# Patient Record
Sex: Female | Born: 1965 | Race: White | Hispanic: No | Marital: Married | Smoking: Former smoker
Health system: Southern US, Community
[De-identification: ages and names within clinical notes are randomized; demographics above are authoritative.]

## PROBLEM LIST (undated history)

## (undated) DIAGNOSIS — G4700 Insomnia, unspecified: Secondary | ICD-10-CM

## (undated) DIAGNOSIS — M797 Fibromyalgia: Secondary | ICD-10-CM

## (undated) DIAGNOSIS — J302 Other seasonal allergic rhinitis: Secondary | ICD-10-CM

## (undated) HISTORY — PX: FOOT SURGERY: SHX648

## (undated) HISTORY — PX: ABDOMINAL HYSTERECTOMY: SHX81

---

## 2004-09-25 HISTORY — PX: ENDOMETRIAL ABLATION: SHX621

## 2009-01-07 DIAGNOSIS — I10 Essential (primary) hypertension: Secondary | ICD-10-CM | POA: Insufficient documentation

## 2010-03-28 ENCOUNTER — Ambulatory Visit: Payer: Self-pay | Admitting: Family Medicine

## 2010-03-28 DIAGNOSIS — R5381 Other malaise: Secondary | ICD-10-CM

## 2010-03-28 DIAGNOSIS — R0602 Shortness of breath: Secondary | ICD-10-CM

## 2010-03-28 DIAGNOSIS — R5383 Other fatigue: Secondary | ICD-10-CM

## 2010-03-29 ENCOUNTER — Encounter: Payer: Self-pay | Admitting: Family Medicine

## 2010-10-25 NOTE — Assessment & Plan Note (Signed)
Summary: TROUBLE GETTING FULL BREATH/TJ   Vital Signs:  Patient Profile:   45 Years Old Female CC:      SOB, weakness, dizziness X this AM, rm 2 Height:     63 inches Weight:      132 pounds O2 Sat:      100 % O2 treatment:    Room Air Temp:     96.9 degrees F oral Pulse rate:   104 / minute Resp:     22 per minute BP sitting:   155 / 89  (left arm) Cuff size:   regular  Pt. in pain?   yes    Location:   left leg    Intensity:   7    Type:       dull  Vitals Entered By: Lajean Saver RN (March 28, 2010 12:55 PM)                   Updated Prior Medication List: AMBIEN 5 MG TABS (ZOLPIDEM TARTRATE) qhs FLEXERIL 5 MG TABS (CYCLOBENZAPRINE HCL) qhs ZOLOFT 50 MG TABS (SERTRALINE HCL) qhs VITAMIN B-12 100 MCG TABS (CYANOCOBALAMIN) once daily VITAMIN D 400 UNIT TABS (CHOLECALCIFEROL) once daily FISH OIL 1000 MG CAPS (OMEGA-3 FATTY ACIDS) once daily CALCIUM 500 MG TABS (CALCIUM) once daily  Current Allergies: No known allergies History of Present Illness Chief Complaint: SOB, weakness, dizziness X this AM, rm 2 History of Present Illness:  Subjective:  Patient complains of awakening this morning feeling somewhat light-headed and fatigued.  She noticed that joints such as her left hip that normally are uncomfortable were more painful than usual.  She also had mild swelling around her eyes and increased sinus congestion.  She had chills but no fever.  She has had a mild cough today.  She feels some restriction in her chest as if she is unable to get a full breath of air, but no shortness of breath or chest pain.  She is followed by Ardell Isaacs, NP for B12 deficiency.  She was started on oral B12 last month and is scheduled for a repeat B12 level next month. She states that she has not eaten since yesterday evening.  REVIEW OF SYSTEMS Constitutional Symptoms       Complains of fatigue.     Denies fever, chills, night sweats, weight loss, and weight gain.  Eyes       Denies  change in vision, eye pain, eye discharge, glasses, contact lenses, and eye surgery. Ear/Nose/Throat/Mouth       Complains of dizziness.      Denies hearing loss/aids, change in hearing, ear pain, ear discharge, frequent runny nose, frequent nose bleeds, sinus problems, sore throat, hoarseness, and tooth pain or bleeding.  Respiratory       Complains of shortness of breath.      Denies dry cough, productive cough, wheezing, asthma, bronchitis, and emphysema/COPD.  Cardiovascular       Denies murmurs, chest pain, and tires easily with exhertion.    Gastrointestinal       Denies stomach pain, nausea/vomiting, diarrhea, constipation, blood in bowel movements, and indigestion. Genitourniary       Denies painful urination, kidney stones, and loss of urinary control. Neurological       Complains of weakness.      Denies headaches, loss of or changes in sensation, numbness, tngling, tremors, paralysis, seizures, and fainting/blackouts. Musculoskeletal       Complains of muscle pain, joint pain, and joint stiffness.  Denies decreased range of motion, redness, swelling, muscle weakness, and gout.      Comments: left leg Skin       Denies bruising, unusual mles/lumps or sores, and hair/skin or nail changes.  Psych       Denies mood changes, temper/anger issues, anxiety/stress, speech problems, depression, and sleep problems. Other Comments: symptoms X this AM. 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  Extraocular  movement is intact.  Conjunctivae are not inflamed.  Ears:  Canals normal.  Tympanic membranes normal.   Nose:  Normal septum.  Normal turbinates, mildly congested.   No sinus tenderness present.  Pharynx:  Normal  Neck:  Supple.  No adenopathy is present.  No thyromegaly is present  Lungs:  Clear to auscultation.  Breath sounds are equal.  Heart:  Regular rate and rhythm without murmurs, rubs, or gallops.  Chest:  No chest wall tenderness Abdomen:  Nontender without masses or hepatosplenomegaly.  Bowel sounds are present.  No CVA or flank tenderness.  Extremities:  No edema.  Pedal pulses are full and equal.  No lower leg tenderness Skin:  No rash. EKG:  normal Chest X-ray:  No acute changes CBC:  WBc 9.0; Hgb 12.3   MCV increased 100.2; MCH increased 32.7 Assessment New Problems: FATIGUE, ACUTE (ICD-780.79) DYSPNEA (ICD-786.05)  SUSPECT EARLY VIRAL PRODROMAL SYMPTOMS.  Patient Education: Patient and/or caregiver instructed in the following: quit smoking.  Plan New Orders: CBC w/Diff [[id number]] EKG w/ Interpretation [93000] T-Chest x-ray, 2 views [71020] Planning Comments:   Patient felt better after eating a snack.   Recommend rest.  Treat symptomatically for now  Follow-up with PCP for B12 deficiency.   The patient and/or caregiver has been counseled thoroughly with regard to medications prescribed including dosage, schedule, interactions, rationale for use, and possible side effects and they verbalize understanding.  Diagnoses and expected course of recovery discussed and will return if not improved as expected or if the condition worsens. Patient and/or caregiver verbalized understanding.   Orders Added: 1)  CBC w/Diff [[id number]] 2)  EKG w/ Interpretation [93000] 3)  T-Chest x-ray, 2 views [71020]

## 2010-12-12 ENCOUNTER — Inpatient Hospital Stay (INDEPENDENT_AMBULATORY_CARE_PROVIDER_SITE_OTHER)
Admission: RE | Admit: 2010-12-12 | Discharge: 2010-12-12 | Disposition: A | Discharge status: Home / Self Care | Payer: Managed Care, Other (non HMO) | Source: Ambulatory Visit | Attending: Family Medicine | Admitting: Family Medicine

## 2010-12-12 ENCOUNTER — Encounter: Payer: Self-pay | Admitting: Family Medicine

## 2010-12-12 DIAGNOSIS — L259 Unspecified contact dermatitis, unspecified cause: Secondary | ICD-10-CM

## 2010-12-22 NOTE — Assessment & Plan Note (Signed)
Summary: REDNESS,SWELLING,RASH/WSE rm 2   Vital Signs:  Patient Profile:   45 Years Old Female CC:      rash/ ? poisin oak Height:     63 inches Weight:      131.75 pounds O2 Sat:      100 % O2 treatment:    Room Air Temp:     98.3 degrees F oral Pulse rate:   80 / minute Resp:     16 per minute BP sitting:   155 / 88  (left arm) Cuff size:   regular  Vitals Entered By: Clemens Catholic LPN (December 12, 2010 6:42 PM)                  Updated Prior Medication List: VITAMIN B-12 100 MCG TABS (CYANOCOBALAMIN) once daily VITAMIN D 400 UNIT TABS (CHOLECALCIFEROL) once daily FISH OIL 1000 MG CAPS (OMEGA-3 FATTY ACIDS) once daily CALCIUM 500 MG TABS (CALCIUM) once daily CYMBALTA 60 MG CPEP (DULOXETINE HCL)  MELOXICAM 15 MG TABS (MELOXICAM)  LORATADINE 10 MG TABS (LORATADINE)  TIZANIDINE HCL 4 MG TABS (TIZANIDINE HCL)  ZOLPIDEM TARTRATE 10 MG TABS (ZOLPIDEM TARTRATE)   Current Allergies: ! SULFAHistory of Present Illness Chief Complaint: rash/ ? poisin oak History of Present Illness:  Subjective:  Patient complains of pruritic rash that appeared on arms several days ago, now on mid abdomen and upper legs.  She believes that she came into contact with poson ivy.  Feels well otherwise.  No one in family with similar rash.  REVIEW OF SYSTEMS Constitutional Symptoms      Denies fever, chills, night sweats, weight loss, weight gain, and fatigue.  Eyes       Denies change in vision, eye pain, eye discharge, glasses, contact lenses, and eye surgery. Ear/Nose/Throat/Mouth       Denies hearing loss/aids, change in hearing, ear pain, ear discharge, dizziness, frequent runny nose, frequent nose bleeds, sinus problems, sore throat, hoarseness, and tooth pain or bleeding.  Respiratory       Denies dry cough, productive cough, wheezing, shortness of breath, asthma, bronchitis, and emphysema/COPD.  Cardiovascular       Denies murmurs, chest pain, and tires easily with exhertion.     Gastrointestinal       Denies stomach pain, nausea/vomiting, diarrhea, constipation, blood in bowel movements, and indigestion. Genitourniary       Denies painful urination, kidney stones, and loss of urinary control. Neurological       Denies paralysis, seizures, and fainting/blackouts. Musculoskeletal       Complains of redness and swelling.      Denies muscle pain, joint pain, joint stiffness, decreased range of motion, muscle weakness, and gout.  Skin       Denies bruising, unusual mles/lumps or sores, and hair/skin or nail changes.  Psych       Denies mood changes, temper/anger issues, anxiety/stress, speech problems, depression, and sleep problems. Other Comments: pt c/o rash all over x 5 days, ? poison ivy. she has tried OTC hydrocortisone cream with no relief.   Past History:  Past Medical History: Reviewed history from 03/28/2010 and no changes required. ?RA herniated disc in L-spine  Past Surgical History: Reviewed history from 03/28/2010 and no changes required. polyps removed from uterus left foot Sx  Family History: Reviewed history from 03/28/2010 and no changes required. throat CA- father  Social History: Reviewed history from 03/28/2010 and no changes required. Married Alcohol use-yes, 24/week Drug use-no Quit smoking 18 years ago  Objective:  Appearance:  Patient appears healthy, stated age, and in no acute distress  Skin:  On upper extremities, upper legs, and mid-abdomen are several scattered non-specific irregular erythematous macules 3 to 5mm dia. Assessment New Problems: CONTACT DERMATITIS (ICD-692.9)  IF RASH RECURS, CONSIDER SCABIES  Plan New Medications/Changes: PREDNISONE 10 MG TABS (PREDNISONE) 2 PO BID for 2 days, then 1 BID for 2 days, then 1 daily for 2 days.  Take PC  #14 x 0, 12/12/2010, Donna Christen MD  New Orders: Est. Patient Level III [phone] Depo- Medrol 40mg  [J1030] Planning Comments:   Depo Medrol 40mg , then begin  tapering course of prednisone tomorrow.   The patient and/or caregiver has been counseled thoroughly with regard to medications prescribed including dosage, schedule, interactions, rationale for use, and possible side effects and they verbalize understanding.  Diagnoses and expected course of recovery discussed and will return if not improved as expected or if the condition worsens. Patient and/or caregiver verbalized understanding.  Prescriptions: PREDNISONE 10 MG TABS (PREDNISONE) 2 PO BID for 2 days, then 1 BID for 2 days, then 1 daily for 2 days.  Take PC  #14 x 0   Entered and Authorized by:   Donna Christen MD   Signed by:   Donna Christen MD on 12/12/2010   Method used:   Print then Give to Patient   RxID:   [id number]   Medication Administration  Injection # 1:    Medication: Depo- Medrol 40mg     Route: IM    Site: RUOQ gluteus    Exp Date: 03/27/2011    Lot #: [id number]    Mfr: Pharmacia    Patient tolerated injection without complications    Given by: Clemens Catholic LPN (December 12, 2010 7:17 PM)  Orders Added: 1)  Est. Patient Level III [95621] 2)  Depo- Medrol 40mg  [J1030]

## 2012-05-08 ENCOUNTER — Emergency Department (INDEPENDENT_AMBULATORY_CARE_PROVIDER_SITE_OTHER)
Admission: EM | Admit: 2012-05-08 | Discharge: 2012-05-08 | Disposition: A | Discharge status: Home / Self Care | Payer: Managed Care, Other (non HMO) | Source: Home / Self Care | Attending: Family Medicine | Admitting: Family Medicine

## 2012-05-08 ENCOUNTER — Encounter: Payer: Self-pay | Admitting: *Deleted

## 2012-05-08 DIAGNOSIS — S61209A Unspecified open wound of unspecified finger without damage to nail, initial encounter: Secondary | ICD-10-CM

## 2012-05-08 DIAGNOSIS — S61216A Laceration without foreign body of right little finger without damage to nail, initial encounter: Secondary | ICD-10-CM

## 2012-05-08 HISTORY — DX: Fibromyalgia: M79.7

## 2012-05-08 HISTORY — DX: Other seasonal allergic rhinitis: J30.2

## 2012-05-08 HISTORY — DX: Insomnia, unspecified: G47.00

## 2012-05-08 NOTE — ED Notes (Signed)
Pt c/o RT 5th digit laceration x 2 hours ago. She states that she cut it on a piece of glass at home. She had a Tdap on 04/29/12.

## 2012-05-08 NOTE — ED Provider Notes (Signed)
History     CSN: [id number]  Arrival date & time 05/08/12  1731   First MD Initiated Contact with Patient 05/08/12 1802      Chief Complaint  Patient presents with  . Extremity Laceration     HPI Comments: Pt c/o RT 5th digit laceration x 2 hours ago. She states that she cut it on a piece of glass at home. She had a Tdap on 04/29/12.   Patient is a 46 y.o. female presenting with skin laceration. The history is provided by the patient.  Laceration  The incident occurred 1 to 2 hours ago. Pain location: right 5th finger. The laceration is 1 cm in size. The laceration mechanism was a broken glass. The pain is mild. The pain has been constant since onset. She reports no foreign bodies present. Her tetanus status is UTD.    Past Medical History  Diagnosis Date  . Fibromyalgia   . Insomnia   . Seasonal allergies     Past Surgical History  Procedure Date  . Foot surgery   . Abdominal hysterectomy     Family History  Problem Relation Age of Onset  . Cancer Father     throat    History  Substance Use Topics  . Smoking status: Former Games developer  . Smokeless tobacco: Not on file  . Alcohol Use: Yes     24 per wk    OB History    Grav Para Term Preterm Abortions TAB SAB Ect Mult Living                  Review of Systems  All other systems reviewed and are negative.    Allergies  Sulfonamide derivatives  Home Medications   Current Outpatient Rx  Name Route Sig Dispense Refill  . DULOXETINE HCL 60 MG PO CPEP Oral Take 60 mg by mouth daily.    Marland Kitchen LORATADINE 10 MG PO TABS Oral Take 10 mg by mouth daily.    Marland Kitchen TIZANIDINE HCL 4 MG PO CAPS Oral Take 4 mg by mouth 3 (three) times daily.    Marland Kitchen ZOLPIDEM TARTRATE 10 MG PO TABS Oral Take 10 mg by mouth at bedtime as needed.      BP 151/90  Pulse 77  Temp 98.3 F (36.8 C) (Oral)  Resp 18  Ht 5\' 3"  (1.6 m)  Wt 135 lb (61.236 kg)  BMI 23.91 kg/m2  SpO2 100%  Physical Exam  Nursing note and vitals  reviewed. Constitutional: She is oriented to person, place, and time. She appears well-developed and well-nourished. No distress.  Eyes: Conjunctivae are normal. Pupils are equal, round, and reactive to light.  Musculoskeletal: Normal range of motion.       Right hand: She exhibits laceration. She exhibits normal range of motion, no tenderness, no bony tenderness, normal two-point discrimination, normal capillary refill, no deformity and no swelling. normal sensation noted.       Hands:      Right fifth finger has a 1cm simple linear laceration over dorsal surface of PIP joint.  Finger has full range of motion.  Distal Neurovascular function is intact.   Neurological: She is alert and oriented to person, place, and time.  Skin: Skin is warm and dry.    ED Course  Procedures  Laceration Repair Discussed benefits and risks of procedure and verbal consent obtained. Using sterile technique and digital 2% lidocaine without epinephrine, cleansed wound with Betadine followed copious lavage with normal saline.  Wound carefully  inspected for debris and foreign bodies; none found.  Wound closed with #4, 5-0 interrupted nylon sutures.  Bacitracin and non-stick sterile dressing applied.  Wound precautions explained to patient.  Return for suture removal in 10 days.       1. Laceration of fifth finger, right       MDM   Advised patient to change dressing daily and apply Bacitracin ointment to wound.  Keep wound clean and dry.  Discussed wound precautions: Return for any signs of infection (or follow-up with family doctor):  Increasing redness, swelling, pain, heat, drainage, etc. Given wound care instruction sheet.  Return in 10 days for suture removal.         Lattie Haw, MD 05/08/12 [phone]

## 2012-05-11 ENCOUNTER — Telehealth: Payer: Self-pay

## 2012-05-11 NOTE — ED Notes (Signed)
Lindsey Mathews states her finger is fine and she is keeping it clean. She will return for suture removal.

## 2012-05-18 ENCOUNTER — Emergency Department
Admission: EM | Admit: 2012-05-18 | Discharge: 2012-05-18 | Disposition: A | Discharge status: Home / Self Care | Payer: Managed Care, Other (non HMO) | Source: Home / Self Care

## 2012-05-18 DIAGNOSIS — IMO0002 Reserved for concepts with insufficient information to code with codable children: Secondary | ICD-10-CM

## 2012-05-18 DIAGNOSIS — Z4802 Encounter for removal of sutures: Secondary | ICD-10-CM

## 2012-05-18 NOTE — ED Provider Notes (Signed)
History     CSN: [id number]  Arrival date & time 05/18/12  0902   First MD Initiated Contact with Patient 05/18/12 [phone]      Chief Complaint  Patient presents with  . Suture / Staple Removal   Patient is a 46 y.o. female presenting with suture removal.  Suture / Staple Removal  The sutures were placed 7 to 10 days ago. Treatments since wound repair include antibiotic ointment use. Fever duration: no fever  There has been no drainage from the wound. There is no redness present. There is no swelling present. The pain has no pain. She has no difficulty moving the affected extremity or digit.    Past Medical History  Diagnosis Date  . Fibromyalgia   . Insomnia   . Seasonal allergies     Past Surgical History  Procedure Date  . Foot surgery   . Abdominal hysterectomy     Family History  Problem Relation Age of Onset  . Cancer Father     throat    History  Substance Use Topics  . Smoking status: Former Games developer  . Smokeless tobacco: Not on file  . Alcohol Use: Yes     24 per wk    OB History    Grav Para Term Preterm Abortions TAB SAB Ect Mult Living                  Review of Systems  All other systems reviewed and are negative.    Allergies  Sulfonamide derivatives  Home Medications   Current Outpatient Rx  Name Route Sig Dispense Refill  . DULOXETINE HCL 60 MG PO CPEP Oral Take 60 mg by mouth daily.    Marland Kitchen LORATADINE 10 MG PO TABS Oral Take 10 mg by mouth daily.    Marland Kitchen TIZANIDINE HCL 4 MG PO CAPS Oral Take 4 mg by mouth 3 (three) times daily.    Marland Kitchen ZOLPIDEM TARTRATE 10 MG PO TABS Oral Take 10 mg by mouth at bedtime as needed.      BP 124/81  Pulse 84  Temp 97.9 F (36.6 C) (Oral)  Resp 18  Ht 5\' 4"  (1.626 m)  Wt 135 lb (61.236 kg)  BMI 23.17 kg/m2  SpO2 100%  Physical Exam  Constitutional: She appears well-developed and well-nourished.  HENT:  Head: Normocephalic and atraumatic.  Eyes: Conjunctivae are normal. Pupils are equal, round, and  reactive to light.  Neck: Normal range of motion. Neck supple.  Cardiovascular: Normal rate and regular rhythm.   Pulmonary/Chest: Effort normal and breath sounds normal.  Abdominal: Soft. Bowel sounds are normal.  Musculoskeletal:       Hands:      R finger laceration.  Well healed.     ED Course  Procedures (including critical care time)  Labs Reviewed - No data to display No results found.   1. Dressing change/suture removal       MDM  Suture removal at bedside.  Well tolerated.  Discussed infectious red flags.  Follow up as needed.      The patient and/or caregiver has been counseled thoroughly with regard to treatment plan and/or medications prescribed including dosage, schedule, interactions, rationale for use, and possible side effects and they verbalize understanding. Diagnoses and expected course of recovery discussed and will return if not improved as expected or if the condition worsens. Patient and/or caregiver verbalized understanding.              Doree Albee, MD 05/18/12  0948 

## 2012-05-18 NOTE — ED Notes (Signed)
Suture removal

## 2015-01-30 ENCOUNTER — Emergency Department (INDEPENDENT_AMBULATORY_CARE_PROVIDER_SITE_OTHER)
Admission: EM | Admit: 2015-01-30 | Discharge: 2015-01-30 | Disposition: A | Discharge status: Home / Self Care | Payer: Managed Care, Other (non HMO) | Source: Home / Self Care | Attending: Family Medicine | Admitting: Family Medicine

## 2015-01-30 ENCOUNTER — Emergency Department (INDEPENDENT_AMBULATORY_CARE_PROVIDER_SITE_OTHER): Payer: Managed Care, Other (non HMO)

## 2015-01-30 ENCOUNTER — Encounter: Payer: Self-pay | Admitting: Emergency Medicine

## 2015-01-30 DIAGNOSIS — M5137 Other intervertebral disc degeneration, lumbosacral region: Secondary | ICD-10-CM | POA: Diagnosis not present

## 2015-01-30 DIAGNOSIS — M5417 Radiculopathy, lumbosacral region: Secondary | ICD-10-CM | POA: Diagnosis not present

## 2015-01-30 DIAGNOSIS — M5416 Radiculopathy, lumbar region: Secondary | ICD-10-CM

## 2015-01-30 MED ORDER — PREDNISONE 20 MG PO TABS: 20.0000 mg | ORAL_TABLET | Freq: Two times a day (BID) | ORAL | Status: DC

## 2015-01-30 MED ORDER — HYDROCODONE-ACETAMINOPHEN 5-325 MG PO TABS: ORAL_TABLET | ORAL | Status: DC

## 2015-01-30 MED ORDER — METHYLPREDNISOLONE SODIUM SUCC 125 MG IJ SOLR
80.0000 mg | Freq: Once | INTRAMUSCULAR | Status: AC
  Administered 2015-01-30: 80 mg via INTRAMUSCULAR

## 2015-01-30 NOTE — Discharge Instructions (Signed)
Apply ice pack for 20 to 30 minutes, 3 to 4 times daily  Continue until pain decreases.    Lumbosacral Radiculopathy Lumbosacral radiculopathy is a pinched nerve or nerves in the low back (lumbosacral area). When this happens you may have weakness in your legs and may not be able to stand on your toes. You may have pain going down into your legs. There may be difficulties with walking normally. There are many causes of this problem. Sometimes this may happen from an injury, or simply from arthritis or boney problems. It may also be caused by other illnesses such as diabetes. If there is no improvement after treatment, further studies may be done to find the exact cause. DIAGNOSIS  X-rays may be needed if the problems become long standing. Electromyograms may be done. This study is one in which the working of nerves and muscles is studied. HOME CARE INSTRUCTIONS   Applications of ice packs may be helpful. Ice can be used in a plastic bag with a towel around it to prevent frostbite to skin. This may be used every 2 hours for 20 to 30 minutes, or as needed, while awake, or as directed by your caregiver.  Only take over-the-counter or prescription medicines for pain, discomfort, or fever as directed by your caregiver.  If physical therapy was prescribed, follow your caregiver's directions. SEEK IMMEDIATE MEDICAL CARE IF:   You have pain not controlled with medications.  You seem to be getting worse rather than better.  You develop increasing weakness in your legs.  You develop loss of bowel or bladder control.  You have difficulty with walking or balance, or develop clumsiness in the use of your legs.  You have a fever. MAKE SURE YOU:   Understand these instructions.  Will watch your condition.  Will get help right away if you are not doing well or get worse. Document Released: 09/11/2005 Document Revised: 12/04/2011 Document Reviewed: 05/01/2008 [facility] Patient Information 2015  [place], [place]. This information is not intended to replace advice given to you by your health care provider. Make sure you discuss any questions you have with your health care provider.

## 2015-01-30 NOTE — ED Notes (Signed)
Patient presents to the [facility] with C/O pain in the lower back radiates into bilateral hips and legs. Times two weeks, denies injury, history of arthritis, rates pain a 7/10.

## 2015-01-30 NOTE — ED Notes (Signed)
To xray via wheelchair.

## 2015-01-30 NOTE — ED Provider Notes (Signed)
CSN: [id number]     Arrival date & time 01/30/15  7353 History   First MD Initiated Contact with Patient 01/30/15 [phone]     Chief Complaint  Patient presents with  . Back Pain      HPI Comments: Patient complains of developing mild lower back ache about 10 days ago worse on the right, occasionally radiating to her right leg.  The pain has become worse with intermittent radiation to her right posterior leg, and sometimes to the left.  She recalls no injury.  She denies bowel or bladder dysfunction, and no saddle numbness.  She feels well otherwise.    Patient is a 49 y.o. female presenting with back pain. The history is provided by the patient.  Back Pain Location:  Lumbar spine Quality:  Aching Radiates to:  L posterior upper leg and R posterior upper leg Pain severity:  Moderate Pain is:  Same all the time Onset quality:  Sudden Duration:  10 days Timing:  Constant Progression:  Worsening Chronicity:  New Context: not recent injury   Relieved by:  NSAIDs Worsened by:  Movement Ineffective treatments:  Heating pad Associated symptoms: leg pain and paresthesias   Associated symptoms: no abdominal pain, no bladder incontinence, no bowel incontinence, no chest pain, no dysuria, no fever, no numbness, no pelvic pain, no perianal numbness, no tingling, no weakness and no weight loss   Risk factors: menopause     Past Medical History  Diagnosis Date  . Fibromyalgia   . Insomnia   . Seasonal allergies    Past Surgical History  Procedure Laterality Date  . Foot surgery    . Abdominal hysterectomy     Family History  Problem Relation Age of Onset  . Cancer Father     throat   History  Substance Use Topics  . Smoking status: Former Research scientist (life sciences)  . Smokeless tobacco: Not on file  . Alcohol Use: Yes     Comment: 24 per wk   OB History    No data available     Review of Systems  Constitutional: Negative for fever and weight loss.  Cardiovascular: Negative for chest pain.    Gastrointestinal: Negative for abdominal pain and bowel incontinence.  Genitourinary: Negative for bladder incontinence, dysuria and pelvic pain.  Musculoskeletal: Positive for back pain.  Neurological: Positive for paresthesias. Negative for tingling, weakness and numbness.  All other systems reviewed and are negative.   Allergies  Sulfonamide derivatives  Home Medications   Prior to Admission medications   Medication Sig Start Date End Date Taking? Authorizing Provider  DULoxetine (CYMBALTA) 60 MG capsule Take 60 mg by mouth daily.    Historical Provider, MD  HYDROcodone-acetaminophen (NORCO/VICODIN) 5-325 MG per tablet Take one by mouth at bedtime as needed for pain 01/30/15   Kandra Nicolas, MD  loratadine (CLARITIN) 10 MG tablet Take 10 mg by mouth daily.    Historical Provider, MD  predniSONE (DELTASONE) 20 MG tablet Take 1 tablet (20 mg total) by mouth 2 (two) times daily. Take with food (begin Sunday 01/31/15) 01/30/15   Kandra Nicolas, MD  tiZANidine (ZANAFLEX) 4 MG capsule Take 4 mg by mouth 3 (three) times daily.    Historical Provider, MD  zolpidem (AMBIEN) 10 MG tablet Take 10 mg by mouth at bedtime as needed.    Historical Provider, MD   BP 159/100 mmHg  Pulse 81  Temp(Src) 98.5 F (36.9 C) (Oral)  Resp 16  Ht 5\' 4"  (1.626 m)  Wt 159 lb 12 oz (72.462 kg)  BMI 27.41 kg/m2  SpO2 100% Physical Exam  Constitutional: She is oriented to person, place, and time. She appears well-developed and well-nourished. No distress.  HENT:  Head: Normocephalic.  Eyes: Conjunctivae are normal. Pupils are equal, round, and reactive to light.  Neck: Normal range of motion.  Cardiovascular: Normal heart sounds.   Pulmonary/Chest: Breath sounds normal.  Abdominal: There is no tenderness.  Musculoskeletal: She exhibits no edema.       Back:  Back:  Decreased range of motion.  Can heel/toe walk and squat without difficulty. Tenderness in the  right paraspinous muscles from L3 to Sacral  area.  Straight leg raising test is negative.  Sitting knee extension test is negative.  Strength and sensation in the lower extremities is normal.  Patellar and achilles reflexes are normal.     Neurological: She is alert and oriented to person, place, and time.  Skin: Skin is warm and dry. No rash noted.     Distribution of patient's right posterior leg paresthesias noted on diagram in blue.  Nursing note and vitals reviewed.   ED Course  Procedures  none  Imaging Review Dg Lumbar Spine Complete  01/30/2015   CLINICAL DATA:  Pt here with c/o lower back pain, spreads down to buttocks and both legs but the Rt leg is the most painful. It is hard for her to stand and walk. This has been ongoing x10 days. She states she has had it looked it recently from another doctor. Denies recent injury, or any injury/surgery to area. She does state that her Lt side was affected with sciatica previously. Was painful for her to lie in the positions needed for x-rays but she was fared well. Hx arthritis  EXAM: LUMBAR SPINE - COMPLETE 4+ VIEW  COMPARISON:  None.  FINDINGS: No fracture. No spondylolisthesis. Mild loss of disc height at L5-S1. Remaining lumbar disc spaces are well preserved. Mild facet joint sclerosis at L5-S1. Remaining facet joints are well preserved  Soft tissues are unremarkable.  IMPRESSION: 1. No fracture or acute finding. 2. Mild disc and facet degenerative changes at L5-S1.   Electronically Signed   By: Lajean Manes M.D.   On: 01/30/2015 10:11     MDM   1. Lumbar back pain with radiculopathy affecting left lower extremity; L5-S1; possibly L4 also    Solumedrol 80mg  IM; begin prednisone burst tomorrow.  Rx for Lortab at bedtime. Apply ice pack for 20 to 30 minutes, 3 to 4 times daily  Continue until pain decreases.  Followup with Dr. Aundria Mems ([facility]) in one week.    Kandra Nicolas, MD 02/03/15 [phone]

## 2015-02-02 ENCOUNTER — Telehealth: Payer: Self-pay | Admitting: *Deleted

## 2015-02-09 ENCOUNTER — Ambulatory Visit (INDEPENDENT_AMBULATORY_CARE_PROVIDER_SITE_OTHER): Payer: Managed Care, Other (non HMO)

## 2015-02-09 ENCOUNTER — Ambulatory Visit (INDEPENDENT_AMBULATORY_CARE_PROVIDER_SITE_OTHER): Payer: Managed Care, Other (non HMO) | Admitting: Sports Medicine

## 2015-02-09 ENCOUNTER — Encounter: Payer: Self-pay | Admitting: Sports Medicine

## 2015-02-09 VITALS — BP 143/84 | HR 99 | Ht 64.0 in

## 2015-02-09 DIAGNOSIS — M25561 Pain in right knee: Secondary | ICD-10-CM

## 2015-02-09 DIAGNOSIS — M25562 Pain in left knee: Secondary | ICD-10-CM | POA: Diagnosis not present

## 2015-02-09 DIAGNOSIS — M5416 Radiculopathy, lumbar region: Secondary | ICD-10-CM

## 2015-02-09 MED ORDER — MELOXICAM 15 MG PO TABS: ORAL_TABLET | ORAL | Status: DC

## 2015-02-09 NOTE — Progress Notes (Signed)
   Subjective:    I'm seeing this patient as a consultation for:  Dr. Assunta Found  CC: Low back pain and radiculopathy  HPI: This is a pleasant 49 year old female, for several years she's had low back pain, radiating down the posterior aspect of the right leg, to the lateral aspect of the right foot, moderate, persistent without radiation, constitutional symptoms, bowel or bladder dysfunction or saddle numbness. She was placed on prednisone which has helped significantly.  Past medical history, Surgical history, Family history not pertinant except as noted below, Social history, Allergies, and medications have been entered into the medical record, reviewed, and no changes needed.   Review of Systems: No headache, visual changes, nausea, vomiting, diarrhea, constipation, dizziness, abdominal pain, skin rash, fevers, chills, night sweats, weight loss, swollen lymph nodes, body aches, joint swelling, muscle aches, chest pain, shortness of breath, mood changes, visual or auditory hallucinations.   Objective:   General: Well Developed, well nourished, and in no acute distress.  Neuro/Psych: Alert and oriented x3, extra-ocular muscles intact, able to move all 4 extremities, sensation grossly intact. Skin: Warm and dry, no rashes noted.  Respiratory: Not using accessory muscles, speaking in full sentences, trachea midline.  Cardiovascular: Pulses palpable, no extremity edema. Abdomen: Does not appear distended. Back Exam:  Inspection: Unremarkable  Motion: Flexion 45 deg, Extension 45 deg, Side Bending to 45 deg bilaterally,  Rotation to 45 deg bilaterally  SLR laying: Negative  XSLR laying: Negative  Palpable tenderness: None. FABER: negative. Sensory change: Gross sensation intact to all lumbar and sacral dermatomes.  Reflexes: 2+ at both patellar tendons, 2+ at the left Achilles, 0+ at the right Achilles, Babinski's downgoing.  Strength at foot  Plantar-flexion: 5/5 Dorsi-flexion: 5/5 Eversion:  5/5 Inversion: 5/5  Leg strength  Quad: 5/5 Hamstring: 5/5 Hip flexor: 5/5 Hip abductors: 5/5  Gait unremarkable.  Lumbar spine x-ray show multilevel degenerative changes.  Impression and Recommendations:   This case required medical decision making of moderate complexity.

## 2015-02-09 NOTE — Assessment & Plan Note (Addendum)
Right-sided S1. Formal physical therapy, return to see me in 4 weeks, MRI for interventional injection if no better. Adding meloxicam. She does have history of fibromyalgia as well.

## 2015-02-09 NOTE — Assessment & Plan Note (Signed)
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

## 2015-02-24 ENCOUNTER — Ambulatory Visit: Payer: Self-pay | Admitting: Rehabilitative and Restorative Service Providers"

## 2015-03-01 ENCOUNTER — Encounter: Payer: Managed Care, Other (non HMO) | Admitting: Physical Therapy

## 2015-03-03 ENCOUNTER — Encounter: Payer: Managed Care, Other (non HMO) | Admitting: Physical Therapy

## 2015-03-08 ENCOUNTER — Encounter: Payer: Managed Care, Other (non HMO) | Admitting: Physical Therapy

## 2015-03-09 ENCOUNTER — Ambulatory Visit: Payer: Managed Care, Other (non HMO) | Admitting: Sports Medicine

## 2015-03-10 ENCOUNTER — Encounter: Payer: Managed Care, Other (non HMO) | Admitting: Physical Therapy

## 2015-03-25 ENCOUNTER — Emergency Department (INDEPENDENT_AMBULATORY_CARE_PROVIDER_SITE_OTHER)
Admission: EM | Admit: 2015-03-25 | Discharge: 2015-03-25 | Disposition: A | Discharge status: Home / Self Care | Payer: Managed Care, Other (non HMO) | Source: Home / Self Care | Attending: Family Medicine | Admitting: Family Medicine

## 2015-03-25 ENCOUNTER — Encounter: Payer: Self-pay | Admitting: Emergency Medicine

## 2015-03-25 DIAGNOSIS — J069 Acute upper respiratory infection, unspecified: Secondary | ICD-10-CM | POA: Diagnosis not present

## 2015-03-25 MED ORDER — SALINE SPRAY 0.65 % NA SOLN: 1.0000 | NASAL | Status: AC | PRN

## 2015-03-25 MED ORDER — PSEUDOEPHEDRINE HCL 60 MG PO TABS: 60.0000 mg | ORAL_TABLET | ORAL | Status: DC | PRN

## 2015-03-25 MED ORDER — ONDANSETRON HCL 4 MG PO TABS: 4.0000 mg | ORAL_TABLET | Freq: Three times a day (TID) | ORAL | Status: DC | PRN

## 2015-03-25 NOTE — ED Notes (Signed)
Reports this is day 5 of congestion, aches, ear, aches, fatigue; denies fever.

## 2015-03-25 NOTE — ED Provider Notes (Signed)
CSN: [id number]     Arrival date & time 03/25/15  0814 History   First MD Initiated Contact with Patient 03/25/15 [phone]     Chief Complaint  Patient presents with  . Nasal Congestion   (Consider location/radiation/quality/duration/timing/severity/associated sxs/prior Treatment) HPI Pt is a 49yo female presenting to UC with c/o gradually improving nasal congestion with associated subjective fever with hot and cold chills, nausea, bilateral ear pain, fatigue, body aches, and frontal headache that improves minimally with Aleve.  Symptoms started 5 days ago. States when symtpoms started initially she slept the majority of the day for 3 days.  She has since been back to work but still has a decreased appetite and nasal congestion.  Nasal congestion is moderate in severity. She reports 1 episode of watery diarrhea today with nausea but no vomiting. No sick contacts.    Past Medical History  Diagnosis Date  . Fibromyalgia   . Insomnia   . Seasonal allergies    Past Surgical History  Procedure Laterality Date  . Foot surgery    . Abdominal hysterectomy     Family History  Problem Relation Age of Onset  . Cancer Father     throat   History  Substance Use Topics  . Smoking status: Former Research scientist (life sciences)  . Smokeless tobacco: Not on file  . Alcohol Use: Yes     Comment: 24 per wk   OB History    No data available     Review of Systems  Constitutional: Positive for fever ( subjective), chills, activity change, appetite change and fatigue. Negative for diaphoresis.  HENT: Positive for congestion, ear pain, sinus pressure and sore throat. Negative for ear discharge, trouble swallowing and voice change.   Respiratory: Positive for cough. Negative for chest tightness and shortness of breath.   Cardiovascular: Negative for chest pain and leg swelling.  Gastrointestinal: Positive for nausea and diarrhea. Negative for vomiting and abdominal pain.  Musculoskeletal: Positive for myalgias and  arthralgias. Negative for gait problem, neck pain and neck stiffness.  Skin: Negative for rash.  Neurological: Positive for dizziness and headaches. Negative for seizures, syncope, facial asymmetry, weakness, light-headedness and numbness.    Allergies  Sulfonamide derivatives  Home Medications   Prior to Admission medications   Medication Sig Start Date End Date Taking? Authorizing Provider  DULoxetine (CYMBALTA) 60 MG capsule Take 60 mg by mouth daily.    Historical Provider, MD  loratadine (CLARITIN) 10 MG tablet Take 10 mg by mouth daily.    Historical Provider, MD  meloxicam (MOBIC) 15 MG tablet One tab PO qAM with breakfast for 2 weeks, then daily prn pain. 02/09/15   Silverio Decamp, MD  ondansetron (ZOFRAN) 4 MG tablet Take 1 tablet (4 mg total) by mouth every 8 (eight) hours as needed for nausea or vomiting. 03/25/15   Noland Fordyce, PA-C  pseudoephedrine (SUDAFED) 60 MG tablet Take 1 tablet (60 mg total) by mouth every 4 (four) hours as needed for congestion. 03/25/15   Noland Fordyce, PA-C  sodium chloride (OCEAN) 0.65 % SOLN nasal spray Place 1 spray into both nostrils as needed for congestion. 03/25/15   Noland Fordyce, PA-C  tiZANidine (ZANAFLEX) 4 MG capsule Take 4 mg by mouth 3 (three) times daily.    Historical Provider, MD   BP 143/86 mmHg  Pulse 83  Temp(Src) 97.9 F (36.6 C) (Oral)  Resp 16  Ht 5\' 4"  (1.626 m)  Wt 152 lb (68.947 kg)  BMI 26.08 kg/m2  SpO2 98%  Physical Exam  Constitutional: She appears well-developed and well-nourished. No distress.  HENT:  Head: Normocephalic and atraumatic.  Right Ear: Hearing, tympanic membrane, external ear and ear canal normal.  Left Ear: Hearing, tympanic membrane, external ear and ear canal normal.  Nose: Mucosal edema present. Right sinus exhibits no maxillary sinus tenderness and no frontal sinus tenderness. Left sinus exhibits no maxillary sinus tenderness and no frontal sinus tenderness.  Mouth/Throat: Uvula is  midline, oropharynx is clear and moist and mucous membranes are normal.  Eyes: Conjunctivae are normal. No scleral icterus.  Neck: Normal range of motion. Neck supple.  Cardiovascular: Normal rate, regular rhythm and normal heart sounds.   Pulmonary/Chest: Effort normal and breath sounds normal. No respiratory distress. She has no wheezes. She has no rales. She exhibits no tenderness.  Abdominal: Soft. Bowel sounds are normal. She exhibits no distension and no mass. There is no tenderness. There is no rebound and no guarding.  Musculoskeletal: Normal range of motion.  Lymphadenopathy:    She has no cervical adenopathy.  Neurological: She is alert.  Skin: Skin is warm and dry. She is not diaphoretic.  Nursing note and vitals reviewed.   ED Course  Procedures (including critical care time) Labs Review Labs Reviewed - No data to display  Imaging Review No results found.   MDM   1. Acute upper respiratory infection    Pt is a 49yo female presenting to UC with gradually improving URI symptoms.  Pt appears well, Vitals: WNL. No respiratory distress. TMs: normal. Nasal mucosa edema on exam, otherwise normal exam. Due to pt improving, will continue to treat symptomatically. No indication for antibiotics at this time. Doubt sinusitis or pneumonia.   Rx: zofran, sudafed, and ocean saline nasal spray. Advised pt to use acetaminophen and ibuprofen as needed for fever and pain. Encouraged rest and fluids. Advised to f/u in 3-4 days if not improving.  Work note for today and tomorrow off provided. Return precautions provided. Pt verbalized understanding and agreement with tx plan.     Noland Fordyce, PA-C 03/25/15 1120

## 2016-01-19 ENCOUNTER — Encounter: Payer: Self-pay | Admitting: Osteopathic Medicine

## 2016-01-19 ENCOUNTER — Other Ambulatory Visit (HOSPITAL_COMMUNITY): Admission: RE | Admit: 2016-01-19 | Payer: Managed Care, Other (non HMO) | Source: Ambulatory Visit

## 2016-01-19 ENCOUNTER — Ambulatory Visit (INDEPENDENT_AMBULATORY_CARE_PROVIDER_SITE_OTHER): Payer: Managed Care, Other (non HMO)

## 2016-01-19 ENCOUNTER — Other Ambulatory Visit (HOSPITAL_COMMUNITY)
Admission: RE | Admit: 2016-01-19 | Discharge: 2016-01-19 | Disposition: A | Discharge status: Home / Self Care | Payer: Managed Care, Other (non HMO) | Source: Ambulatory Visit | Attending: Osteopathic Medicine | Admitting: Osteopathic Medicine

## 2016-01-19 ENCOUNTER — Ambulatory Visit (INDEPENDENT_AMBULATORY_CARE_PROVIDER_SITE_OTHER): Payer: Managed Care, Other (non HMO) | Admitting: Osteopathic Medicine

## 2016-01-19 VITALS — BP 134/90 | HR 94 | Ht 64.5 in | Wt 152.0 lb

## 2016-01-19 DIAGNOSIS — Z124 Encounter for screening for malignant neoplasm of cervix: Secondary | ICD-10-CM

## 2016-01-19 DIAGNOSIS — R7989 Other specified abnormal findings of blood chemistry: Secondary | ICD-10-CM

## 2016-01-19 DIAGNOSIS — G479 Sleep disorder, unspecified: Secondary | ICD-10-CM | POA: Insufficient documentation

## 2016-01-19 DIAGNOSIS — Z0000 Encounter for general adult medical examination without abnormal findings: Secondary | ICD-10-CM | POA: Insufficient documentation

## 2016-01-19 DIAGNOSIS — Z1239 Encounter for other screening for malignant neoplasm of breast: Secondary | ICD-10-CM

## 2016-01-19 DIAGNOSIS — Z1159 Encounter for screening for other viral diseases: Secondary | ICD-10-CM

## 2016-01-19 DIAGNOSIS — Z01419 Encounter for gynecological examination (general) (routine) without abnormal findings: Secondary | ICD-10-CM | POA: Insufficient documentation

## 2016-01-19 DIAGNOSIS — D473 Essential (hemorrhagic) thrombocythemia: Secondary | ICD-10-CM

## 2016-01-19 DIAGNOSIS — M5137 Other intervertebral disc degeneration, lumbosacral region: Secondary | ICD-10-CM | POA: Insufficient documentation

## 2016-01-19 DIAGNOSIS — Z1231 Encounter for screening mammogram for malignant neoplasm of breast: Secondary | ICD-10-CM

## 2016-01-19 DIAGNOSIS — Z114 Encounter for screening for human immunodeficiency virus [HIV]: Secondary | ICD-10-CM

## 2016-01-19 DIAGNOSIS — Z13220 Encounter for screening for lipoid disorders: Secondary | ICD-10-CM

## 2016-01-19 DIAGNOSIS — F419 Anxiety disorder, unspecified: Secondary | ICD-10-CM | POA: Insufficient documentation

## 2016-01-19 DIAGNOSIS — G43909 Migraine, unspecified, not intractable, without status migrainosus: Secondary | ICD-10-CM | POA: Insufficient documentation

## 2016-01-19 DIAGNOSIS — E538 Deficiency of other specified B group vitamins: Secondary | ICD-10-CM | POA: Insufficient documentation

## 2016-01-19 DIAGNOSIS — Z1211 Encounter for screening for malignant neoplasm of colon: Secondary | ICD-10-CM

## 2016-01-19 DIAGNOSIS — M797 Fibromyalgia: Secondary | ICD-10-CM | POA: Insufficient documentation

## 2016-01-19 DIAGNOSIS — N840 Polyp of corpus uteri: Secondary | ICD-10-CM | POA: Insufficient documentation

## 2016-01-19 DIAGNOSIS — T7840XA Allergy, unspecified, initial encounter: Secondary | ICD-10-CM | POA: Insufficient documentation

## 2016-01-19 DIAGNOSIS — Z1151 Encounter for screening for human papillomavirus (HPV): Secondary | ICD-10-CM | POA: Insufficient documentation

## 2016-01-19 LAB — COMPLETE METABOLIC PANEL WITH GFR
ALT: 7 U/L (ref 6–29)
AST: 17 U/L (ref 10–35)
Albumin: 4.2 g/dL (ref 3.6–5.1)
Alkaline Phosphatase: 65 U/L (ref 33–130)
BILIRUBIN TOTAL: 0.4 mg/dL (ref 0.2–1.2)
BUN: 9 mg/dL (ref 7–25)
CALCIUM: 9.6 mg/dL (ref 8.6–10.4)
CO2: 27 mmol/L (ref 20–31)
Chloride: 102 mmol/L (ref 98–110)
Creat: 0.68 mg/dL (ref 0.50–1.05)
GFR, Est Non African American: >89 mL/min (ref >=60)
Glucose, Bld: 75 mg/dL (ref 65–99)
Potassium: 4 mmol/L (ref 3.5–5.3)
Sodium: 140 mmol/L (ref 135–146)
TOTAL PROTEIN: 7.7 g/dL (ref 6.1–8.1)

## 2016-01-19 LAB — CBC WITH DIFFERENTIAL/PLATELET
BASOS ABS: 59 {cells}/uL (ref 0–200)
BASOS PCT: 1 %
EOS ABS: 236 {cells}/uL (ref 15–500)
Eosinophils Relative: 4 %
HEMATOCRIT: 40.7 % (ref 35.0–45.0)
Hemoglobin: 13.3 g/dL (ref 11.7–15.5)
LYMPHS PCT: 35 %
Lymphs Abs: 2065 cells/uL (ref 850–3900)
MCH: 31.4 pg (ref 27.0–33.0)
MCHC: 32.7 g/dL (ref 32.0–36.0)
MCV: 96 fL (ref 80.0–100.0)
MPV: 9.4 fL (ref 7.5–12.5)
Monocytes Absolute: 649 cells/uL (ref 200–950)
Monocytes Relative: 11 %
Neutro Abs: 2891 cells/uL (ref 1500–7800)
Neutrophils Relative %: 49 %
Platelets: 474 10*3/uL — ABNORMAL HIGH (ref 140–400)
RBC: 4.24 MIL/uL (ref 3.80–5.10)
RDW: 13.5 % (ref 11.0–15.0)
WBC: 5.9 10*3/uL (ref 3.8–10.8)

## 2016-01-19 LAB — LIPID PANEL
CHOLESTEROL: 180 mg/dL (ref 125–200)
HDL: 74 mg/dL (ref >=46)
LDL Cholesterol: 89 mg/dL (ref <130)
TRIGLYCERIDES: 86 mg/dL (ref <150)
Total CHOL/HDL Ratio: 2.4 Ratio (ref <=5.0)
VLDL: 17 mg/dL (ref <30)

## 2016-01-19 LAB — TSH: TSH: 2.17 mIU/L

## 2016-01-19 NOTE — Patient Instructions (Signed)
Plan to follow-up every 12 months for annual wellness exam. Please don't hesitate to make an appointment sooner if you're having any acute concerns or problems! You should hear back about your Pap and other lab results in one week, please let us know if you don't hear anything. Please call our office back about whether your insurance covers the Cologuard test. You should also hear about scheduling a mammogram.   At any visits to any of your specialists, or if you receive vaccines through your workplace or pharmacy, please give them our clinic information so that they can forward [place] any records, including any tests which are done or changes to your medications. This allows all your physicians to communicate effectively, putting your primary care doctor at the center of your medical care and allowing [place] to effectively coordinate your care.   Let's plan to follow-up here in the office in 12 months for annual wellness exam. If you would like to, you can get lab work done a few days before that visit so that we can go over the results in person at your appointment. You do not need an appointment to go downstairs to the lab for blood draws, they should have the orders in the system for you and if there is any problem they can always call upstairs and we can solve it pretty quickly and get her blood drawn that day.   Please let us know if there is anything else we can do for you. Take care! -Dr. Loni Muse.

## 2016-01-19 NOTE — Progress Notes (Signed)
HPI: Lindsey Mathews is a 50 y.o. female who presents to [facility] today for chief complaint of:  Chief Complaint  Patient presents with  . Establish Care    PATIENT WOULD LIKE ANNUAL PHYSICAL AND PAP     Patient here for annual wellness exam. Preventive care reviewed as below.   Rheumatology is managing other prescription medications.    Past medical, social and family history reviewed: Past Medical History  Diagnosis Date  . Fibromyalgia   . Insomnia   . Seasonal allergies    Past Surgical History  Procedure Laterality Date  . Foot surgery    . Abdominal hysterectomy     Social History  Substance Use Topics  . Smoking status: Former Research scientist (life sciences)  . Smokeless tobacco: Not on file  . Alcohol Use: Yes     Comment: 24 per wk   Family History  Problem Relation Age of Onset  . Cancer Father     throat  . Alcohol abuse Father   . Hypertension Mother   . Heart attack Maternal Grandfather   . ALS Paternal Aunt     Current Outpatient Prescriptions  Medication Sig Dispense Refill  . DULoxetine (CYMBALTA) 60 MG capsule Take 60 mg by mouth 2 (two) times daily.     Marland Kitchen loratadine (CLARITIN) 10 MG tablet Take 10 mg by mouth daily.    . orphenadrine (NORFLEX) 100 MG tablet Take 100 mg by mouth 2 (two) times daily as needed for muscle spasms.    . sodium chloride (OCEAN) 0.65 % SOLN nasal spray Place 1 spray into both nostrils as needed for congestion. 1 Bottle 0   No current facility-administered medications for this visit.   Allergies  Allergen Reactions  . Sulfonamide Derivatives       Review of Systems: CONSTITUTIONAL:  No  fever, no chills, No  unintentional weight changes HEAD/EYES/EARS/NOSE/THROAT: No  headache, no vision change, no hearing change, No  sore throat, No  sinus pressure CARDIAC: No  chest pain, No  pressure, No palpitations, No  orthopnea RESPIRATORY: No  cough, No  shortness of breath/wheeze GASTROINTESTINAL: No   nausea, No  vomiting, No  abdominal pain, No  blood in stool, No  diarrhea, No  constipation  MUSCULOSKELETAL: No  myalgia/arthralgia GENITOURINARY: No  incontinence, No  abnormal genital bleeding/discharge SKIN: No  rash/wounds/concerning lesions HEM/ONC: No  easy bruising/bleeding, No  abnormal lymph node ENDOCRINE: No polyuria/polydipsia/polyphagia, No  heat/cold intolerance  NEUROLOGIC: No  weakness, No  dizziness, No  slurred speech PSYCHIATRIC: No  concerns with depression, No  concerns with anxiety, No sleep problems  Exam:  BP 134/90 mmHg  Pulse 94  Ht 5' 4.5" (1.638 m)  Wt 152 lb (68.947 kg)  BMI 25.70 kg/m2 Constitutional: VS see above. General Appearance: alert, well-developed, well-nourished, NAD Eyes: Normal lids and conjunctive, non-icteric sclera, PERRLA Ears, Nose, Mouth, Throat: MMM, Normal external inspection ears/nares/mouth/lips/gums, TM normal bilaterally. Pharynx no erythema, no exudate.  Neck: No masses, trachea midline. No thyroid enlargement/tenderness/mass appreciated. No lymphadenopathy Respiratory: Normal respiratory effort. no wheeze, no rhonchi, no rales Cardiovascular: S1/S2 normal, no murmur, no rub/gallop auscultated. RRR. No lower extremity edema. Gastrointestinal: Nontender, no masses. No hepatomegaly, no splenomegaly. No hernia appreciated. Bowel sounds normal. Rectal exam deferred.  Musculoskeletal: Gait normal. No clubbing/cyanosis of digits.  Neurological: No cranial nerve deficit on limited exam. Motor and sensation intact and symmetric Skin: warm, dry, intact. No rash/ulcer. No concerning nevi or subq nodules on limited exam.  Psychiatric: Normal judgment/insight. Normal mood and affect. Oriented x3.  GYN: No lesions/ulcers to external genitalia, normal urethra, atrophic rmal vaginal mucosa, physiologic discharge, cervix normal without lesions, uterus not enlarged or tender, adnexa no masses and nontender BREAST: No rashes/skin changes, normal  fibrous breast tissue, no masses or tenderness, normal nipple without discharge, normal axilla    ASSESSMENT/PLAN:  Annual physical exam - Plan: MM DIGITAL SCREENING BILATERAL, Cytology - PAP, HIV antibody, Hepatitis C antibody, Cologuard, CBC with Differential/Platelet, COMPLETE METABOLIC PANEL WITH GFR, Lipid panel, TSH, VITAMIN D 25 Hydroxy (Vit-D Deficiency, Fractures)  Low serum vitamin D - Plan: VITAMIN D 25 Hydroxy (Vit-D Deficiency, Fractures)  Elevated platelet count (HCC) - Plan: CBC with Differential/Platelet  Breast cancer screening - Plan: MM DIGITAL SCREENING BILATERAL  Cervical cancer screening - Plan: Cytology - PAP  Screening for HIV (human immunodeficiency virus) - Plan: HIV antibody  Need for hepatitis C screening test - Plan: Hepatitis C antibody  Lipid screening - Plan: Lipid panel  Colon cancer screening - Plan: Cologuard     FEMALE PREVENTIVE CARE  ANNUAL SCREENING/COUNSELING Tobacco - previously - quit 1993 Alcohol - social drinker Diet/Exercise - HEALTHY HABITS DISCUSSED TO DECREASE CV RISK Sexual Health - Yes with female. STI - The patient denies history of sexually transmitted disease. INTERESTED IN STI TESTING - no Depression - PQH2 Negative Domestic violence concerns - no HTN SCREENING - SEE VITALS Vaccination status - SEE BELOW  INFECTIOUS DISEASE SCREENING HIV - needs GC/CT - does not need HepC - needs TB - if risk/required by employer - does not need  DISEASE SCREENING Lipid - (Low risk screen M35/F45; High risk screen M25/F35 if HTN, Tob, FH CHD M<55/F<65) - needs DM2 (45+ or Risk = FH 1st deg DM, Hx GDM, overweight/sedentary, high-risk ethnicity, HTN) - needs Osteoporosis - age 86+ or one sooner if risk - does not need  CANCER SCREENING Cervical - Pap q3 yr age 61+, Pap + HPV q5y age 26+ - PAP - needs Breast - Mammo age 55+ (C) and biennial age 60-75 (A) - 41 - needs Lung - annual low dose CT Chest age 55-75 w/ 30+ PY,  current/quit past 15 years - CT - does not need Colon - age 22+ or 52 years of age prior to [place] Dx - GI REFERRAL - needs  ADULT VACCINATION Influenza - annual - was offered and declined by the patient  Td booster every 10 years - already has HPV - age <57yo - was not indicated Zoster - age 61+ - was given Rx Pneumonia - age 46+ sooner if risk (DM, smoker, other) - was not indicated  OTHER Fall - exercise and Vit D age 95+ - does not need Consider ASA - age 44-59 - does not need   All questions were answered. Visit summary with updated medication list and pertinent instructions was printed for patient. ER/RTC precautions were reviewed with the patient. Return if needed, for [employer].

## 2016-01-20 LAB — HIV ANTIBODY (ROUTINE TESTING W REFLEX): HIV 1&2 Ab, 4th Generation: NONREACTIVE

## 2016-01-20 LAB — HEPATITIS C ANTIBODY: HCV Ab: NEGATIVE

## 2016-01-20 LAB — VITAMIN D 25 HYDROXY (VIT D DEFICIENCY, FRACTURES): Vit D, 25-Hydroxy: 24 ng/mL — ABNORMAL LOW (ref 30–100)

## 2016-01-20 LAB — CYTOLOGY - PAP

## 2016-01-25 ENCOUNTER — Telehealth: Payer: Self-pay | Admitting: Family Medicine

## 2016-01-25 NOTE — Telephone Encounter (Signed)
Please call pt and inform her her pap smear was negative. She is not due for 5 years.

## 2016-01-25 NOTE — Telephone Encounter (Signed)
Called patient to give results but was unable to reach patient by phone. Letter has been sent. Rhonda Cunningham,CMA

## 2016-01-25 NOTE — Telephone Encounter (Signed)
Patient has been informed. Rayaan Lorah,CMA  

## 2017-09-01 ENCOUNTER — Other Ambulatory Visit: Payer: Self-pay

## 2017-09-01 ENCOUNTER — Emergency Department (INDEPENDENT_AMBULATORY_CARE_PROVIDER_SITE_OTHER)
Admission: EM | Admit: 2017-09-01 | Discharge: 2017-09-01 | Disposition: A | Discharge status: Home / Self Care | Payer: Managed Care, Other (non HMO) | Source: Home / Self Care | Attending: Family Medicine | Admitting: Family Medicine

## 2017-09-01 ENCOUNTER — Encounter: Payer: Self-pay | Admitting: Emergency Medicine

## 2017-09-01 ENCOUNTER — Emergency Department (INDEPENDENT_AMBULATORY_CARE_PROVIDER_SITE_OTHER): Payer: Managed Care, Other (non HMO)

## 2017-09-01 DIAGNOSIS — J320 Chronic maxillary sinusitis: Secondary | ICD-10-CM

## 2017-09-01 DIAGNOSIS — J0100 Acute maxillary sinusitis, unspecified: Secondary | ICD-10-CM | POA: Diagnosis not present

## 2017-09-01 DIAGNOSIS — J069 Acute upper respiratory infection, unspecified: Secondary | ICD-10-CM

## 2017-09-01 MED ORDER — ONDANSETRON 4 MG PO TBDP: ORAL_TABLET | ORAL | 0 refills | Status: AC

## 2017-09-01 MED ORDER — AMOXICILLIN 875 MG PO TABS: 875.0000 mg | ORAL_TABLET | Freq: Two times a day (BID) | ORAL | 0 refills | Status: DC

## 2017-09-01 MED ORDER — PREDNISONE 20 MG PO TABS: ORAL_TABLET | ORAL | 0 refills | Status: AC

## 2017-09-01 NOTE — Discharge Instructions (Signed)
Take plain guaifenesin (1200mg extended release tabs such as Mucinex) twice daily, with plenty of water, for cough and congestion.   Get adequate rest.   °May use Afrin nasal spray (or generic oxymetazoline) each morning for about 5 days and then discontinue.  Also recommend using saline nasal spray several times daily and saline nasal irrigation (AYR is a common brand).  Use Flonase nasal spray each morning after using Afrin nasal spray and saline nasal irrigation. °Try warm salt water gargles for sore throat.  °Stop all antihistamines for now, and other non-prescription cough/cold preparations. °May take Delsym Cough Suppressant at bedtime for nighttime cough.  °  °

## 2017-09-01 NOTE — ED Provider Notes (Signed)
[facility]    CSN: [id number] Arrival date & time: 09/01/17  0908     History   Chief Complaint Chief Complaint  Patient presents with  . Facial Pain    HPI Lindsey Mathews is a 51 y.o. female.   Patient complains of five day history of bilateral facial pressure and increased sinus congestion.  Her upper teeth ache at times, and she believes that she has had chills.  She has had a mild sore throat for two days.  She has a history of perennial rhinitis, worse in the spring, and has had numerous sinus infections in the past. Review of records indicate that a CT head done 03/14/13 because of a fall revealed opacification of the left maxillary sinus and multiple caries.   The history is provided by the patient.    Past Medical History:  Diagnosis Date  . Fibromyalgia   . Insomnia   . Seasonal allergies     Patient Active Problem List   Diagnosis Date Noted  . Annual physical exam 01/19/2016  . Elevated platelet count 01/19/2016  . Low serum vitamin D 01/19/2016  . Breast cancer screening 01/19/2016  . Cervical cancer screening 01/19/2016  . Lipid screening 01/19/2016  . Screening for HIV (human immunodeficiency virus) 01/19/2016  . Need for hepatitis C screening test 01/19/2016  . Colon cancer screening 01/19/2016  . Anxiety 01/19/2016  . B12 deficiency 01/19/2016  . Fibromyalgia 01/19/2016  . Headache, migraine 01/19/2016  . Endometrial polyp 01/19/2016  . Disturbance in sleep behavior 01/19/2016  . Degeneration of intervertebral disc of lumbosacral region 01/19/2016  . Allergic state 01/19/2016  . Right lumbar radiculopathy 02/09/2015  . Right knee pain 02/09/2015  . FATIGUE, ACUTE 03/28/2010  . DYSPNEA 03/28/2010  . Absolute anemia 01/07/2009  . Essential (primary) hypertension 01/07/2009  . Cephalalgia 01/07/2009    Past Surgical History:  Procedure Laterality Date  . ABDOMINAL HYSTERECTOMY    . ENDOMETRIAL ABLATION  2006  . FOOT SURGERY        OB History    No data available       Home Medications    Prior to Admission medications   Medication Sig Start Date End Date Taking? Authorizing Provider  amoxicillin (AMOXIL) 875 MG tablet Take 1 tablet (875 mg total) by mouth 2 (two) times daily. 09/01/17   Kandra Nicolas, MD  DULoxetine (CYMBALTA) 60 MG capsule Take 60 mg by mouth 2 (two) times daily.     [provider]  loratadine (CLARITIN) 10 MG tablet Take 10 mg by mouth daily.    [provider]  ondansetron (ZOFRAN ODT) 4 MG disintegrating tablet Take one tab by mouth Q6hr prn nausea.  Dissolve under tongue. 09/01/17   Kandra Nicolas, MD  orphenadrine (NORFLEX) 100 MG tablet Take 100 mg by mouth 2 (two) times daily as needed for muscle spasms.    [provider]  predniSONE (DELTASONE) 20 MG tablet Take one tab by mouth twice daily for 5 days, then one daily for 3 days. Take with food. 09/01/17   Kandra Nicolas, MD  sodium chloride (OCEAN) 0.65 % SOLN nasal spray Place 1 spray into both nostrils as needed for congestion. 03/25/15   Noe Gens, PA-C    Family History Family History  Problem Relation Age of Onset  . Cancer Father        throat  . Alcohol abuse Father   . Hypertension Mother   . Heart attack Maternal  Grandfather   . ALS Paternal Aunt     Social History Social History   Tobacco Use  . Smoking status: Former Research scientist (life sciences)  . Smokeless tobacco: Never Used  Substance Use Topics  . Alcohol use: Yes    Comment: 24 per wk  . Drug use: No     Allergies   Sulfonamide derivatives   Review of Systems Review of Systems + mild sore throat + occasional cough No pleuritic pain No wheezing + nasal congestion + post-nasal drainage + sinus pain/pressure No itchy/red eyes ? earache No hemoptysis No SOB No fever, + chills + nausea No vomiting No abdominal pain No diarrhea No urinary symptoms No skin rash + fatigue No myalgias + headache Used OTC meds without  relief   Physical Exam Triage Vital Signs ED Triage Vitals  Enc Vitals Group     BP 09/01/17 0930 (!) 157/95     Pulse Rate 09/01/17 0930 86     Resp 09/01/17 0930 16     Temp 09/01/17 0930 97.6 F (36.4 C)     Temp Source 09/01/17 0930 Oral     SpO2 09/01/17 0930 100 %     Weight 09/01/17 0931 165 lb (74.8 kg)     Height 09/01/17 0931 5\' 4"  (1.626 m)     Head Circumference --      Peak Flow --      Pain Score 09/01/17 0932 4     Pain Loc --      Pain Edu? --      Excl. in [place]? --    No data found.  Updated Vital Signs BP (!) 157/95 (BP Location: Left Arm)   Pulse 86   Temp 97.6 F (36.4 C) (Oral)   Resp 16   Ht 5\' 4"  (1.626 m)   Wt 165 lb (74.8 kg)   SpO2 100%   BMI 28.32 kg/m   Visual Acuity Right Eye Distance:   Left Eye Distance:   Bilateral Distance:    Right Eye Near:   Left Eye Near:    Bilateral Near:     Physical Exam Nursing notes and Vital Signs reviewed. Appearance:  Patient appears stated age, and in no acute distress Eyes:  Pupils are equal, round, and reactive to light and accomodation.  Extraocular movement is intact.  Conjunctivae are not inflamed  Ears:  Canals normal.  Tympanic membranes normal.  Nose:  Congested turbinates.  Maxillary sinus tenderness is present.  Pharynx:  Normal Neck:  Supple.  Enlarged posterior/lateral nodes are palpated bilaterally, tender to palpation on the left.   Lungs:  Clear to auscultation.  Breath sounds are equal.  Moving air well. Heart:  Regular rate and rhythm without murmurs, rubs, or gallops.  Abdomen:  Nontender without masses or hepatosplenomegaly.  Bowel sounds are present.  No CVA or flank tenderness.  Extremities:  No edema.  Skin:  No rash present.    UC Treatments / Results  Labs (all labs ordered are listed, but only abnormal results are displayed) Labs Reviewed - No data to display  EKG  EKG Interpretation None       Radiology Dg Sinuses Complete  Result Date: 09/01/2017 CLINICAL  DATA:  Patient reports nasal congestion and drainage with bi-lateral facial pressure x 4 days. More pressure on left side. Hx of seasonal allergies and sinus infections. EXAM: PARANASAL SINUSES - COMPLETE 3 + VIEW COMPARISON:  None. FINDINGS: Opacified left maxillary sinus. Remaining sinuses are clear. No air-fluid levels. Osseous  structures and soft tissues are unremarkable. IMPRESSION: 1. Opacified left maxillary sinus. Electronically Signed   By: Lajean Manes M.D.   On: 09/01/2017 10:09    Procedures Procedures (including critical care time)  Medications Ordered in UC Medications - No data to display   Initial Impression / Assessment and Plan / UC Course  I have reviewed the triage vital signs and the nursing notes.  Pertinent labs & imaging results that were available during my care of the patient were reviewed by me and considered in my medical decision making (see chart for details).    Begin amoxicillin 875mg  BID for 10 days, and prednisone burst/taper. Rx for Zofran ODT at patient's request. Take plain guaifenesin (1200mg  extended release tabs such as Mucinex) twice daily, with plenty of water, for cough and congestion.   Get adequate rest.   May use Afrin nasal spray (or generic oxymetazoline) each morning for about 5 days and then discontinue.  Also recommend using saline nasal spray several times daily and saline nasal irrigation (AYR is a common brand).  Use Flonase nasal spray each morning after using Afrin nasal spray and saline nasal irrigation. Try warm salt water gargles for sore throat.  Stop all antihistamines for now, and other non-prescription cough/cold preparations. May take Delsym Cough Suppressant at bedtime for nighttime cough.  Followup with ENT if not improved 10 days.    Final Clinical Impressions(s) / UC Diagnoses   Final diagnoses:  Maxillary sinusitis, chronic  Viral URI    ED Discharge Orders        Ordered    amoxicillin (AMOXIL) 875 MG tablet  2  times daily     09/01/17 1043    predniSONE (DELTASONE) 20 MG tablet     09/01/17 1043    ondansetron (ZOFRAN ODT) 4 MG disintegrating tablet     09/01/17 1044          Kandra Nicolas, MD 09/01/17 1142

## 2017-09-01 NOTE — ED Triage Notes (Signed)
Patient reports nasal congestion and drainage with pressure over sinus areas for past 4 days; no OTC today.

## 2017-10-07 ENCOUNTER — Emergency Department (INDEPENDENT_AMBULATORY_CARE_PROVIDER_SITE_OTHER): Payer: Managed Care, Other (non HMO)

## 2017-10-07 ENCOUNTER — Encounter: Payer: Self-pay | Admitting: Emergency Medicine

## 2017-10-07 ENCOUNTER — Emergency Department (INDEPENDENT_AMBULATORY_CARE_PROVIDER_SITE_OTHER)
Admission: EM | Admit: 2017-10-07 | Discharge: 2017-10-07 | Disposition: A | Discharge status: Home / Self Care | Payer: Managed Care, Other (non HMO) | Source: Home / Self Care | Attending: Family Medicine | Admitting: Family Medicine

## 2017-10-07 DIAGNOSIS — R0781 Pleurodynia: Secondary | ICD-10-CM

## 2017-10-07 DIAGNOSIS — R0789 Other chest pain: Secondary | ICD-10-CM | POA: Diagnosis not present

## 2017-10-07 DIAGNOSIS — Z23 Encounter for immunization: Secondary | ICD-10-CM

## 2017-10-07 MED ORDER — MELOXICAM 7.5 MG PO TABS: 7.5000 mg | ORAL_TABLET | Freq: Every day | ORAL | 0 refills | Status: AC

## 2017-10-07 MED ORDER — INFLUENZA VAC SPLIT QUAD 0.5 ML IM SUSY: 0.5000 mL | PREFILLED_SYRINGE | INTRAMUSCULAR | Status: DC

## 2017-10-07 MED ORDER — METHOCARBAMOL 500 MG PO TABS
500.0000 mg | ORAL_TABLET | Freq: Two times a day (BID) | ORAL | 0 refills | Status: AC
Start: 2017-10-07 — End: ?

## 2017-10-07 MED ORDER — INFLUENZA VAC SPLIT QUAD 0.5 ML IM SUSY
0.5000 mL | PREFILLED_SYRINGE | INTRAMUSCULAR | Status: AC
  Administered 2017-10-07: 0.5 mL via INTRAMUSCULAR

## 2017-10-07 NOTE — ED Triage Notes (Signed)
Patient complaining of bending down to pick up a pair of pants on Friday and began having sharp pain on left side, under arm and breast area.  No apparent injury.

## 2017-10-07 NOTE — ED Provider Notes (Signed)
[facility]    CSN: [id number] Arrival date & time: 10/07/17  1123     History   Chief Complaint Chief Complaint  Patient presents with  . Chest Pain    rib pain    HPI Lindsey Mathews is a 52 y.o. female.   HPI Lindsey Mathews is a 52 y.o. female presenting to UC with c/o Left side chest wall pain and Left flank pain that started 2 days ago.  Pt was sitting on the cough, reached down to pick up a pair of jeans but when she came back up she felt a sharp pain in her side.  Pain was "bad enough to call out in pain."  Pain eventually eased off but it has been waxing and waning since, worse with certain movements.  She has been taking Tylenol w/o relief.  Denies rashes or bruising to the area. She was tx for a sinus infection about 1 week ago. While she never developed much of a cough, she did have sneezing and notes her sneezing can bee very strong at times but does not recall pulling a muscle then.  Pt wants to make sure she did not crack a rib. No hx of rib fractures in the past.    Past Medical History:  Diagnosis Date  . Fibromyalgia   . Insomnia   . Seasonal allergies     Patient Active Problem List   Diagnosis Date Noted  . Annual physical exam 01/19/2016  . Elevated platelet count 01/19/2016  . Low serum vitamin D 01/19/2016  . Breast cancer screening 01/19/2016  . Cervical cancer screening 01/19/2016  . Lipid screening 01/19/2016  . Screening for HIV (human immunodeficiency virus) 01/19/2016  . Need for hepatitis C screening test 01/19/2016  . Colon cancer screening 01/19/2016  . Anxiety 01/19/2016  . B12 deficiency 01/19/2016  . Fibromyalgia 01/19/2016  . Headache, migraine 01/19/2016  . Endometrial polyp 01/19/2016  . Disturbance in sleep behavior 01/19/2016  . Degeneration of intervertebral disc of lumbosacral region 01/19/2016  . Allergic state 01/19/2016  . Right lumbar radiculopathy 02/09/2015  . Right knee pain 02/09/2015  . FATIGUE, ACUTE  03/28/2010  . DYSPNEA 03/28/2010  . Absolute anemia 01/07/2009  . Essential (primary) hypertension 01/07/2009  . Cephalalgia 01/07/2009    Past Surgical History:  Procedure Laterality Date  . ABDOMINAL HYSTERECTOMY    . ENDOMETRIAL ABLATION  2006  . FOOT SURGERY      OB History    No data available       Home Medications    Prior to Admission medications   Medication Sig Start Date End Date Taking? Authorizing Provider  DULoxetine (CYMBALTA) 60 MG capsule Take 60 mg by mouth 2 (two) times daily.     [provider]  loratadine (CLARITIN) 10 MG tablet Take 10 mg by mouth daily.    [provider]  meloxicam (MOBIC) 7.5 MG tablet Take 1-2 tablets (7.5-15 mg total) by mouth daily. 10/07/17   Noe Gens, PA-C  methocarbamol (ROBAXIN) 500 MG tablet Take 1 tablet (500 mg total) by mouth 2 (two) times daily. 10/07/17   Noe Gens, PA-C  ondansetron (ZOFRAN ODT) 4 MG disintegrating tablet Take one tab by mouth Q6hr prn nausea.  Dissolve under tongue. 09/01/17   Kandra Nicolas, MD  orphenadrine (NORFLEX) 100 MG tablet Take 100 mg by mouth 2 (two) times daily as needed for muscle spasms.    [provider]  predniSONE (DELTASONE) 20 MG tablet  Take one tab by mouth twice daily for 5 days, then one daily for 3 days. Take with food. 09/01/17   Kandra Nicolas, MD  sodium chloride (OCEAN) 0.65 % SOLN nasal spray Place 1 spray into both nostrils as needed for congestion. 03/25/15   Noe Gens, PA-C    Family History Family History  Problem Relation Age of Onset  . Cancer Father        throat  . Alcohol abuse Father   . Hypertension Mother   . Heart attack Maternal Grandfather   . ALS Paternal Aunt     Social History Social History   Tobacco Use  . Smoking status: Former Research scientist (life sciences)  . Smokeless tobacco: Never Used  Substance Use Topics  . Alcohol use: Yes    Comment: 24 per wk  . Drug use: No     Allergies   Sulfonamide  derivatives   Review of Systems Review of Systems  Respiratory: Negative for shortness of breath and wheezing.   Cardiovascular: Positive for chest pain (Left side/ribs). Negative for palpitations.  Genitourinary: Positive for flank pain (Left). Negative for dysuria, frequency and hematuria.  Musculoskeletal: Positive for back pain (Left mid). Negative for myalgias.     Physical Exam Triage Vital Signs ED Triage Vitals  Enc Vitals Group     BP 10/07/17 1216 (!) 154/90     Pulse Rate 10/07/17 1216 91     Resp --      Temp 10/07/17 1216 98.2 F (36.8 C)     Temp Source 10/07/17 1216 Oral     SpO2 10/07/17 1216 99 %     Weight 10/07/17 1217 168 lb 8 oz (76.4 kg)     Height 10/07/17 1217 5\' 4"  (1.626 m)     Head Circumference --      Peak Flow --      Pain Score 10/07/17 1217 3     Pain Loc --      Pain Edu? --      Excl. in [place]? --    No data found.  Updated Vital Signs BP (!) 154/90 (BP Location: Right Arm)   Pulse 91   Temp 98.2 F (36.8 C) (Oral)   Ht 5\' 4"  (1.626 m)   Wt 168 lb 8 oz (76.4 kg)   SpO2 99%   BMI 28.92 kg/m   Visual Acuity Right Eye Distance:   Left Eye Distance:   Bilateral Distance:    Right Eye Near:   Left Eye Near:    Bilateral Near:     Physical Exam  Constitutional: She is oriented to person, place, and time. She appears well-developed and well-nourished.  Non-toxic appearance. She does not appear ill. No distress.  HENT:  Head: Normocephalic and atraumatic.  Eyes: EOM are normal.  Neck: Normal range of motion.  Cardiovascular: Normal rate and regular rhythm.  Pulmonary/Chest: Effort normal and breath sounds normal. She has no decreased breath sounds. She has no wheezes.     She exhibits tenderness.  Chest wall tenderness from mid back on Left side to Left axillary and under Left breast. No crepitus or deformity.     Musculoskeletal: Normal range of motion.  Neurological: She is alert and oriented to person, place, and time.   Skin: Skin is warm and dry.  Psychiatric: She has a normal mood and affect. Her behavior is normal.  Nursing note and vitals reviewed.    UC Treatments / Results  Labs (all labs ordered are  listed, but only abnormal results are displayed) Labs Reviewed - No data to display  EKG  EKG Interpretation None       Radiology Dg Ribs Unilateral W/chest Left  Result Date: 10/07/2017 CLINICAL DATA:  Left side rib pain EXAM: LEFT RIBS AND CHEST - 3+ VIEW COMPARISON:  03/28/2010 FINDINGS: No fracture or other bone lesions are seen involving the ribs. There is no evidence of pneumothorax or pleural effusion. Both lungs are clear. Heart size and mediastinal contours are within normal limits. IMPRESSION: Negative. Electronically Signed   By: Rolm Baptise M.D.   On: 10/07/2017 12:54    Procedures Procedures (including critical care time)  Medications Ordered in UC Medications - No data to display   Initial Impression / Assessment and Plan / UC Course  I have reviewed the triage vital signs and the nursing notes.  Pertinent labs & imaging results that were available during my care of the patient were reviewed by me and considered in my medical decision making (see chart for details).     Reviewed imaging with tx  Reassured pt pain likely from muscle strain, no evidence of rib fracture or any other acute abnormality on imaging.   Will tx with Robaxin and Meloxicam Encouraged alternating ice and heat F/u with PCP in 1 week if not improving.   Final Clinical Impressions(s) / UC Diagnoses   Final diagnoses:  Left-sided chest wall pain  Flu vaccine need    ED Discharge Orders        Ordered    methocarbamol (ROBAXIN) 500 MG tablet  2 times daily     10/07/17 1302    meloxicam (MOBIC) 7.5 MG tablet  Daily     10/07/17 1302       Controlled Substance Prescriptions [place] Controlled Substance Registry consulted? Not Applicable   Tyrell Antonio 10/07/17 1322

## 2017-10-07 NOTE — Discharge Instructions (Signed)
°  Robaxin (methocarbamol) is a muscle relaxer and may cause drowsiness. Do not drink alcohol, drive, or operate heavy machinery while taking.  Meloxicam (Mobic) is an antiinflammatory to help with pain and inflammation.  Do not take ibuprofen, Advil, Aleve, or any other medications that contain NSAIDs while taking meloxicam as this may cause stomach upset or even ulcers if taken in large amounts for an extended period of time.

## 2017-10-08 ENCOUNTER — Telehealth: Payer: Self-pay | Admitting: *Deleted

## 2017-10-08 NOTE — Telephone Encounter (Signed)
Pt called wants a work note to say she can work, but she needs to avoid activities that cause pain. She said she is able to do most of her job and they do not offer light duty. Per dr Assunta Found ok to write note for no strenuous activity for one week.

## 2018-02-27 IMAGING — DX DG SINUSES COMPLETE 3+V
3 series · 3 of 3 positions shown · non-contrast
Comparison: None.

CLINICAL DATA: Patient reports nasal congestion and drainage with
bi-lateral facial pressure x 4 days. More pressure on left side. Hx
of seasonal allergies and sinus infections.

EXAM:
PARANASAL SINUSES - COMPLETE 3 + VIEW

[pns waters]
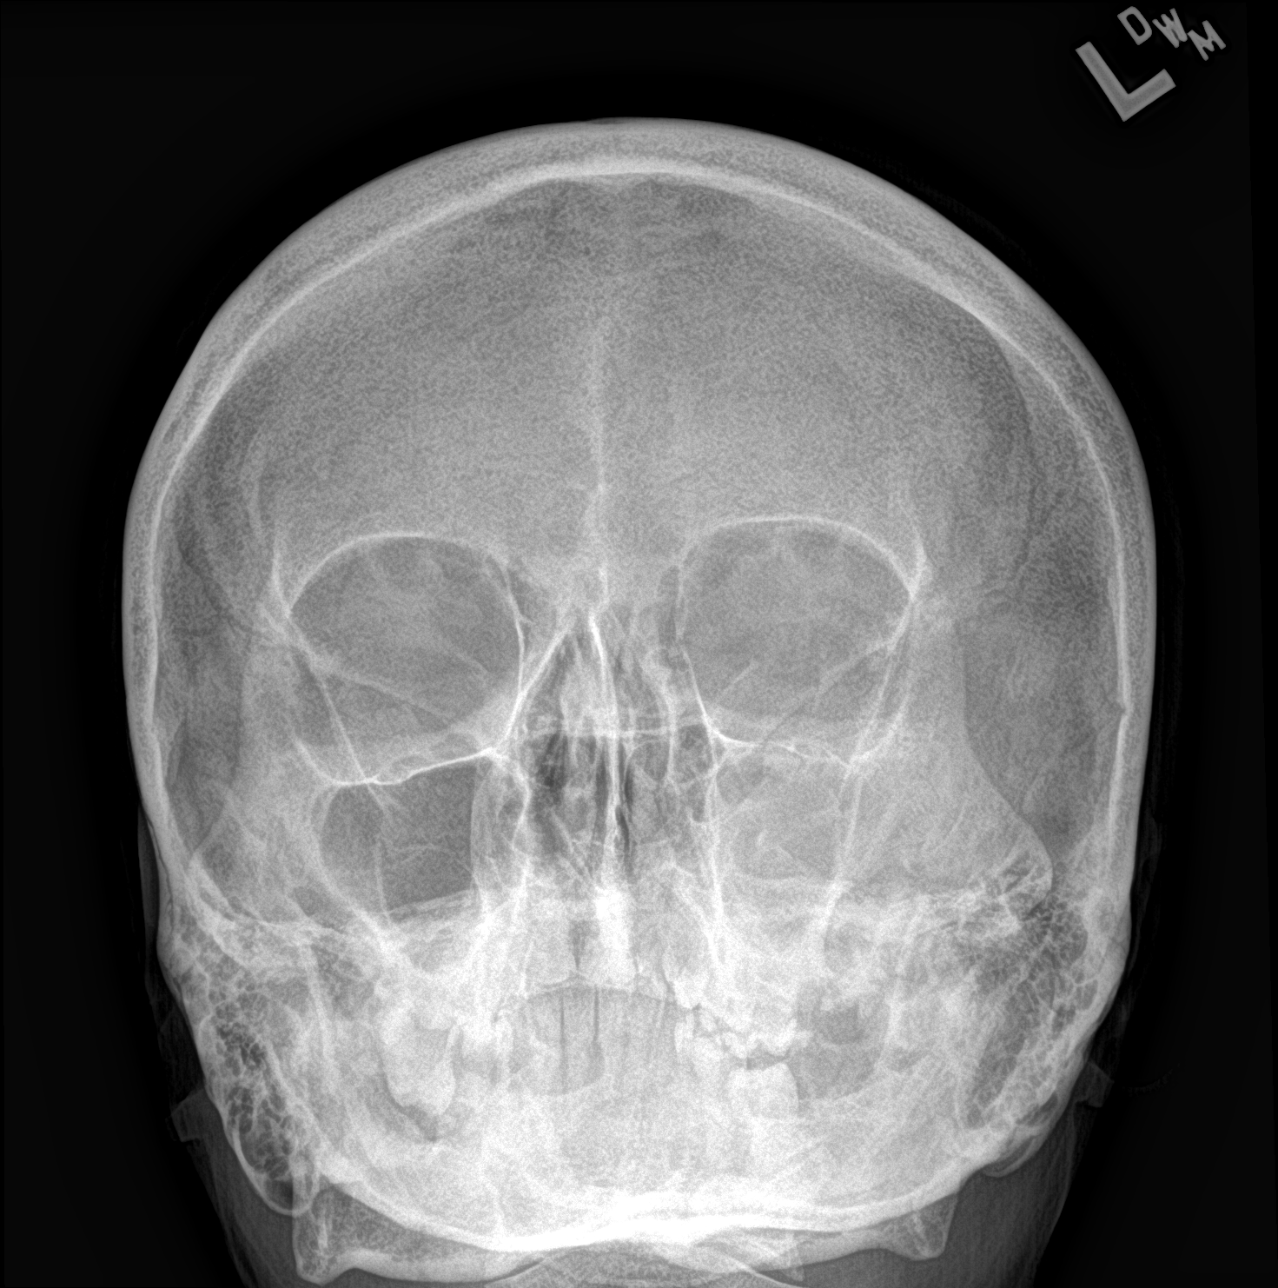

[[person_name]]
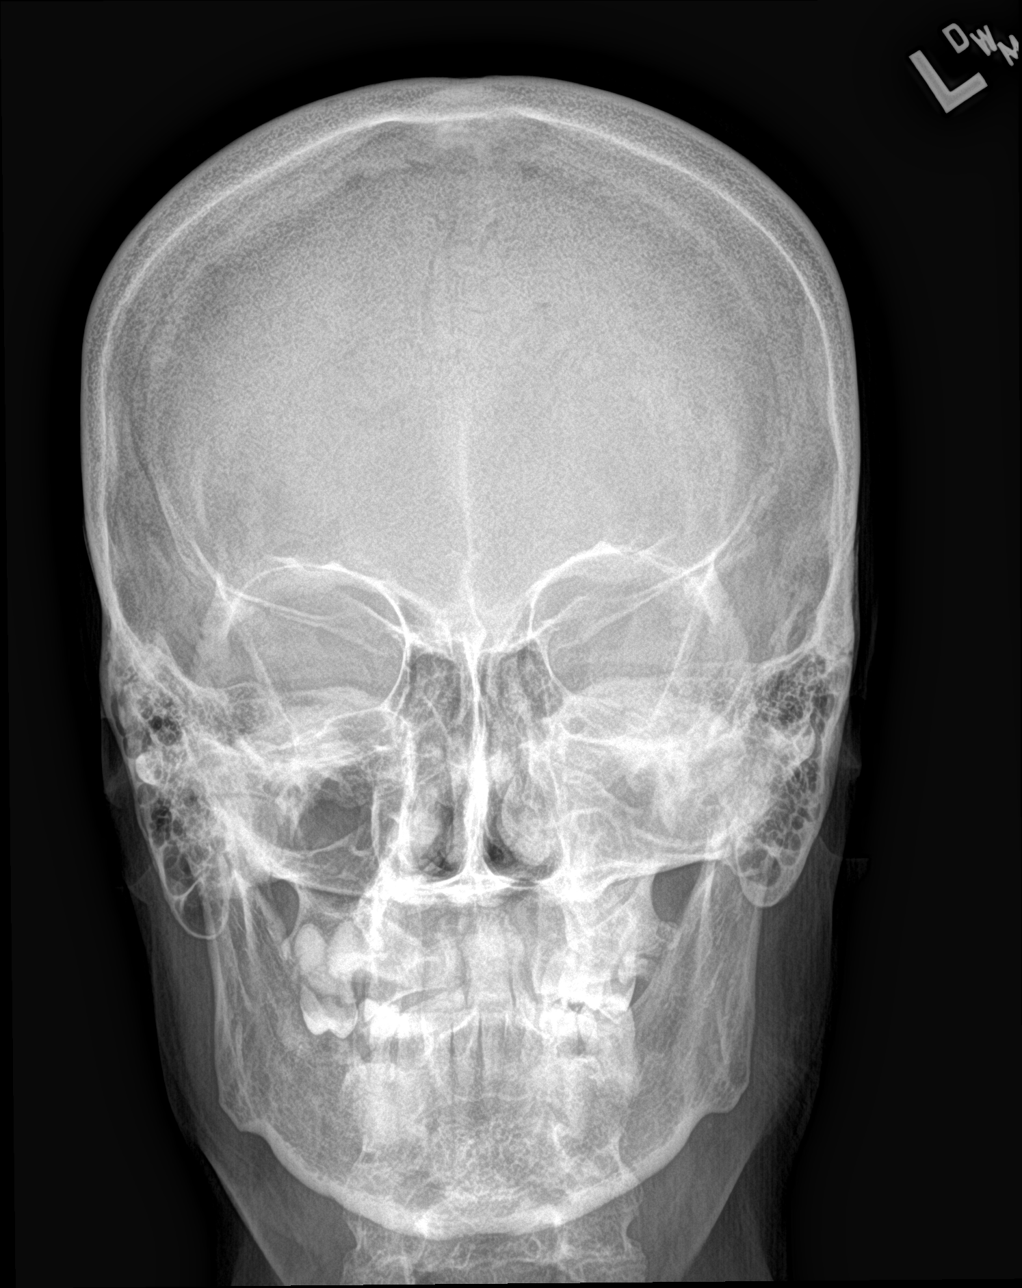

[pns lat]
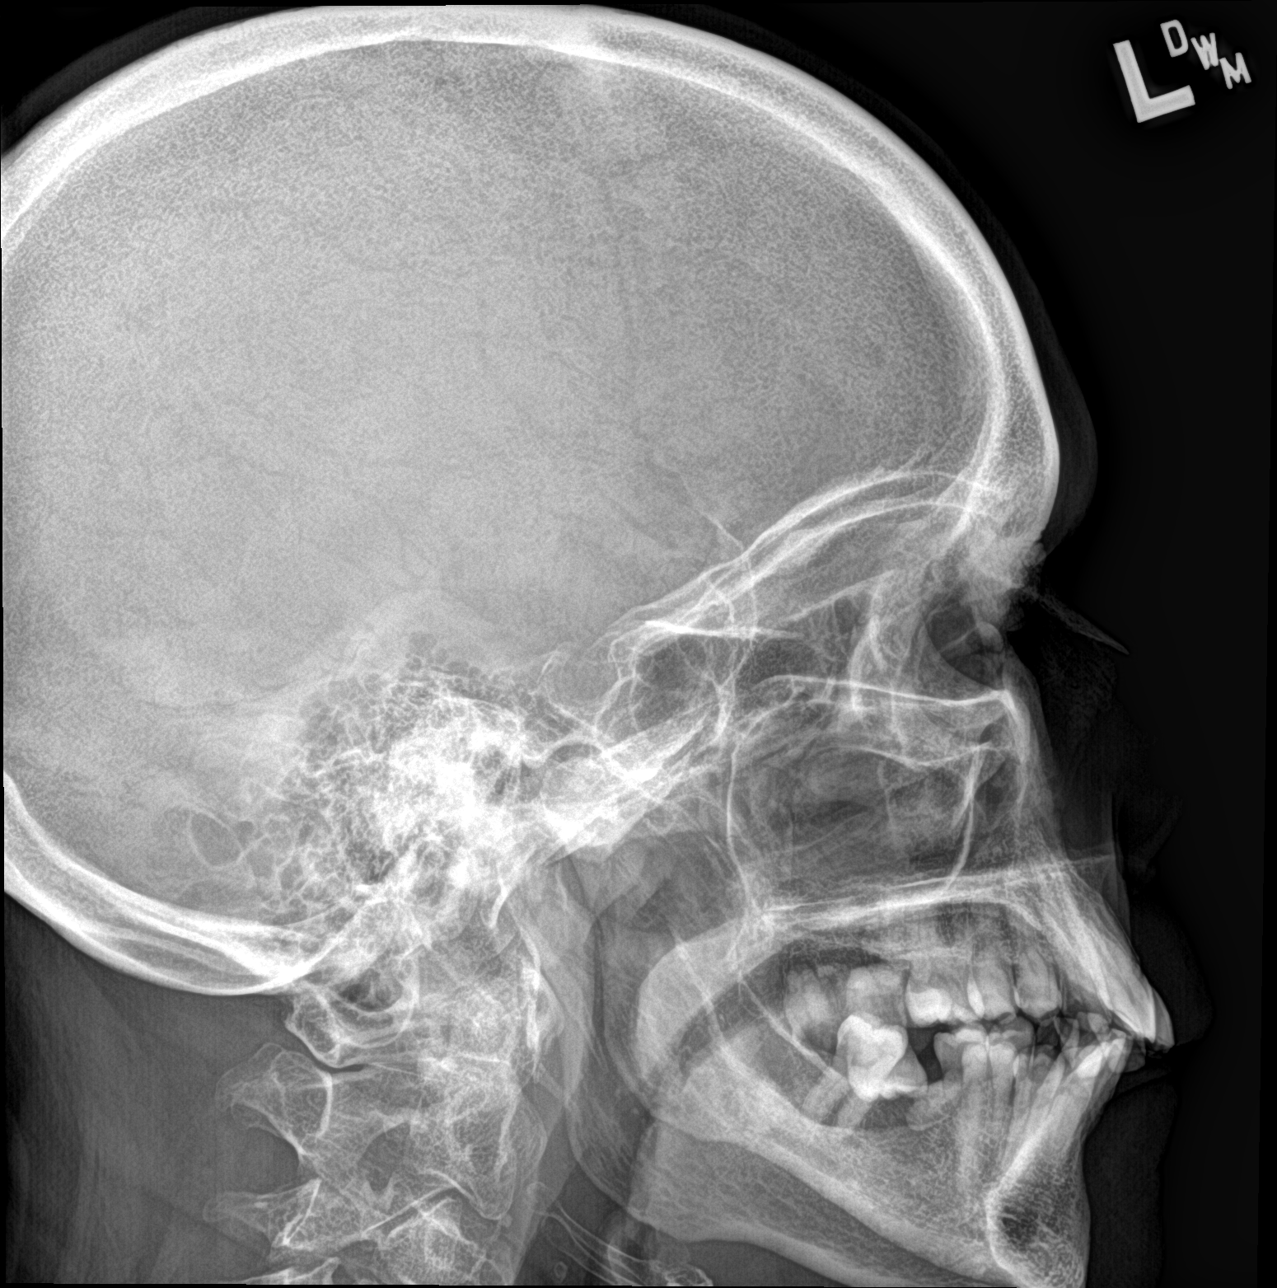

[3 of 3 positions shown; findings below may reference images not displayed]

FINDINGS: Opacified left maxillary sinus. Remaining sinuses are clear. No
air-fluid levels.

Osseous structures and soft tissues are unremarkable.
IMPRESSION: 1. Opacified left maxillary sinus.

## 2019-12-20 ENCOUNTER — Ambulatory Visit: Payer: Self-pay | Attending: Internal Medicine

## 2019-12-20 DIAGNOSIS — Z23 Encounter for immunization: Secondary | ICD-10-CM

## 2019-12-20 NOTE — Progress Notes (Signed)
   Covid-19 Vaccination Clinic  Name:  Lindsey Mathews    MRN: [id number] DOB: 1966/08/10  12/20/2019  Lindsey Mathews was observed post Covid-19 immunization for 15 minutes without incident. She was provided with Vaccine Information Sheet and instruction to access the V-Safe system.   Lindsey Mathews was instructed to call 911 with any severe reactions post vaccine: Marland Kitchen Difficulty breathing  . Swelling of face and throat  . A fast heartbeat  . A bad rash all over body  . Dizziness and weakness   Immunizations Administered    Name Date Dose VIS Date Route   Pfizer COVID-19 Vaccine 12/20/2019 11:11 AM 0.3 mL 09/05/2019 Intramuscular   Manufacturer: [place]   Lot: [id number]   [place]: [id number]

## 2020-01-13 ENCOUNTER — Ambulatory Visit: Payer: Self-pay | Attending: Internal Medicine

## 2020-01-13 DIAGNOSIS — Z23 Encounter for immunization: Secondary | ICD-10-CM

## 2020-01-13 NOTE — Progress Notes (Signed)
   Covid-19 Vaccination Clinic  Name:  Lindsey Mathews    MRN: [id number] DOB: 1966-02-17  01/13/2020  [name] was observed post Covid-19 immunization for 15 minutes without incident. She was provided with Vaccine Information Sheet and instruction to access the V-Safe system.   [name] was instructed to call 911 with any severe reactions post vaccine: Marland Kitchen Difficulty breathing  . Swelling of face and throat  . A fast heartbeat  . A bad rash all over body  . Dizziness and weakness   Immunizations Administered    Name Date Dose VIS Date Route   Pfizer COVID-19 Vaccine 01/13/2020  3:07 PM 0.3 mL 11/19/2018 Intramuscular   Manufacturer: [place]   Lot: [id number]   [place]: [id number]
